# Patient Record
Sex: Female | Born: 1995 | Race: White | Hispanic: No | Marital: Single | Smoking: Current every day smoker
Health system: Southern US, Community
[De-identification: ages and names within clinical notes are randomized; demographics above are authoritative.]

## PROBLEM LIST (undated history)

## (undated) DIAGNOSIS — F32A Depression, unspecified: Secondary | ICD-10-CM

## (undated) DIAGNOSIS — F329 Major depressive disorder, single episode, unspecified: Secondary | ICD-10-CM

## (undated) HISTORY — PX: TONSILLECTOMY: SUR1361

---
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
## 2015-01-07 ENCOUNTER — Emergency Department
Admission: EM | Admit: 2015-01-07 | Discharge: 2015-01-07 | Disposition: A | Discharge status: Home / Self Care | Payer: Medicaid Other | Attending: Emergency Medicine | Admitting: Emergency Medicine

## 2015-01-07 ENCOUNTER — Encounter: Payer: Self-pay | Admitting: Emergency Medicine

## 2015-01-07 DIAGNOSIS — R519 Headache, unspecified: Secondary | ICD-10-CM

## 2015-01-07 DIAGNOSIS — F17200 Nicotine dependence, unspecified, uncomplicated: Secondary | ICD-10-CM | POA: Diagnosis not present

## 2015-01-07 DIAGNOSIS — R51 Headache: Secondary | ICD-10-CM | POA: Insufficient documentation

## 2015-01-07 HISTORY — DX: Depression, unspecified: F32.A

## 2015-01-07 HISTORY — DX: Major depressive disorder, single episode, unspecified: F32.9

## 2015-01-07 MED ORDER — KETOROLAC TROMETHAMINE 60 MG/2ML IM SOLN
60.0000 mg | Freq: Once | INTRAMUSCULAR | Status: AC
  Administered 2015-01-07: 60 mg via INTRAMUSCULAR
  Filled 2015-01-07: qty 2

## 2015-01-07 MED ORDER — DIAZEPAM 5 MG PO TABS
5.0000 mg | ORAL_TABLET | Freq: Once | ORAL | Status: AC
  Administered 2015-01-07: 5 mg via ORAL
  Filled 2015-01-07: qty 1

## 2015-01-07 MED ORDER — BUTALBITAL-APAP-CAFFEINE 50-325-40 MG PO TABS: 1.0000 | ORAL_TABLET | Freq: Four times a day (QID) | ORAL | Status: DC | PRN

## 2015-01-07 MED ORDER — IBUPROFEN 800 MG PO TABS: 800.0000 mg | ORAL_TABLET | Freq: Three times a day (TID) | ORAL | Status: DC | PRN

## 2015-01-07 NOTE — Discharge Instructions (Signed)

## 2015-01-07 NOTE — ED Notes (Signed)
Pt presents with headache for about three weeks, was started on new meds around that same time, side effect includes headaches, pcp aware and told pt that it would wear off. Pt take paroxetine for depression. Pt states she stopped taking the med and headaches continue.

## 2015-01-07 NOTE — ED Provider Notes (Signed)
[facility] Emergency Department Provider Note  ____________________________________________  Time seen: Approximately 6:40 PM  I have reviewed the triage vital signs and the nursing notes.   HISTORY  Chief Complaint Headache   HPI Christina Hall is a 19 y.o. female who presents for the headache for about 3 weeks. Patient states that she started a new medication for depression at the time however she has stopped the medications to complain of headaches. Denies any nausea vomiting photophobia phonophobia. Rates headache is a 5/10 at this point.Patient reports can't rule out stress currently going through a divorce, custody battle with her child.   Past Medical History  Diagnosis Date  . Depression     There are no active problems to display for this patient.   Past Surgical History  Procedure Laterality Date  . Tonsillectomy      Current Outpatient Rx  Name  Route  Sig  Dispense  Refill  . butalbital-acetaminophen-caffeine (FIORICET) 50-325-40 MG tablet   Oral   Take 1-2 tablets by mouth every 6 (six) hours as needed for headache.   20 tablet   0   . ibuprofen (ADVIL,MOTRIN) 800 MG tablet   Oral   Take 1 tablet (800 mg total) by mouth every 8 (eight) hours as needed.   30 tablet   0     Allergies Review of patient's allergies indicates no known allergies.  No family history on file.  Social History Social History  Substance Use Topics  . Smoking status: Current Some Day Smoker  . Smokeless tobacco: None  . Alcohol Use: No    Review of Systems Constitutional: No fever/chills Eyes: No visual changes. ENT: No sore throat. Cardiovascular: Denies chest pain. Respiratory: Denies shortness of breath. Gastrointestinal: No abdominal pain.  No nausea, no vomiting.  No diarrhea.  No constipation. Genitourinary: Negative for dysuria. Musculoskeletal: Negative for back pain. Skin: Negative for rash. Neurological: Positive for  headaches, negative for focal weakness or numbness.  10-point ROS otherwise negative.  ____________________________________________   PHYSICAL EXAM:  VITAL SIGNS: ED Triage Vitals  Enc Vitals Group     BP 01/07/15 1805 150/85 mmHg     Pulse Rate 01/07/15 1805 81     Resp 01/07/15 1805 20     Temp 01/07/15 1805 98.4 F (36.9 C)     Temp Source 01/07/15 1805 Oral     SpO2 01/07/15 1805 99 %     Weight 01/07/15 1805 139 lb (63.05 kg)     Height 01/07/15 1805 5\' 5"  (1.651 m)     Head Cir --      Peak Flow --      Pain Score 01/07/15 1806 7     Pain Loc --      Pain Edu? --      Excl. in GC? --     Constitutional: Alert and oriented. Well appearing and in no acute distress. Eyes: Conjunctivae are normal. PERRL. EOMI. Head: Atraumatic. Nose: No congestion/rhinnorhea. Mouth/Throat: Mucous membranes are moist.  Oropharynx non-erythematous. Neck: No stridor.  No cervical tenderness Cardiovascular: Normal rate, regular rhythm. Grossly normal heart sounds.  Good peripheral circulation. Respiratory: Normal respiratory effort.  No retractions. Lungs CTAB. Musculoskeletal: No lower extremity tenderness nor edema.  No joint effusions. Neurologic:  Normal speech and language. No gross focal neurologic deficits are appreciated. No gait instability. Skin:  Skin is warm, dry and intact. No rash noted. Psychiatric: Mood and affect are normal. Speech and behavior are normal.  ____________________________________________  LABS (all labs ordered are listed, but only abnormal results are displayed)  Labs Reviewed - No data to display ____________________________________________   PROCEDURES  Procedure(s) performed: None  Critical Care performed: No  ____________________________________________   INITIAL IMPRESSION / ASSESSMENT AND PLAN / ED COURSE  Pertinent labs & imaging results that were available during my care of the patient were reviewed by me and considered in my medical  decision making (see chart for details).  Rx given for Fioricet for headaches. Continue with Motrin 800 mg 3 times a day as needed. Follow up with PCP or return to the ER with any worsening symptomology. Patient states improvement prior to discharge ____________________________________________   FINAL CLINICAL IMPRESSION(S) / ED DIAGNOSES  Final diagnoses:  Nonintractable episodic headache, unspecified headache type     Evangeline Dakin, PA-C 01/07/15 1910  Myrna Blazer, MD 01/07/15 2209

## 2015-10-20 ENCOUNTER — Encounter: Payer: Self-pay | Admitting: Emergency Medicine

## 2015-10-20 ENCOUNTER — Emergency Department: Payer: Medicaid Other

## 2015-10-20 ENCOUNTER — Emergency Department
Admission: EM | Admit: 2015-10-20 | Discharge: 2015-10-20 | Disposition: A | Discharge status: Home / Self Care | Payer: Medicaid Other | Attending: Emergency Medicine | Admitting: Emergency Medicine

## 2015-10-20 DIAGNOSIS — O4692 Antepartum hemorrhage, unspecified, second trimester: Secondary | ICD-10-CM | POA: Insufficient documentation

## 2015-10-20 DIAGNOSIS — Z5321 Procedure and treatment not carried out due to patient leaving prior to being seen by health care provider: Secondary | ICD-10-CM | POA: Insufficient documentation

## 2015-10-20 DIAGNOSIS — F17200 Nicotine dependence, unspecified, uncomplicated: Secondary | ICD-10-CM | POA: Diagnosis not present

## 2015-10-20 DIAGNOSIS — O209 Hemorrhage in early pregnancy, unspecified: Secondary | ICD-10-CM

## 2015-10-20 DIAGNOSIS — Z3A22 22 weeks gestation of pregnancy: Secondary | ICD-10-CM | POA: Insufficient documentation

## 2015-10-20 LAB — URINALYSIS COMPLETE WITH MICROSCOPIC (ARMC ONLY)
BACTERIA UA: NONE SEEN
BILIRUBIN URINE: NEGATIVE
Glucose, UA: NEGATIVE mg/dL
Ketones, ur: NEGATIVE mg/dL
Nitrite: NEGATIVE
PROTEIN: NEGATIVE mg/dL
Specific Gravity, Urine: 1.017 (ref 1.005–1.030)
pH: 6 (ref 5.0–8.0)

## 2015-10-20 LAB — POCT PREGNANCY, URINE: PREG TEST UR: NEGATIVE

## 2015-10-20 LAB — ABO/RH: ABO/RH(D): O POS

## 2015-10-20 LAB — HCG, QUANTITATIVE, PREGNANCY: HCG, BETA CHAIN, QUANT, S: 12 m[IU]/mL — AB (ref <5)

## 2015-10-20 NOTE — ED Notes (Signed)
Pt standing at the doorway, this RN asked if there was anything she needed help with. Pt asked when she would be seen by the MD. This RN told her that I wasn't sure as the MD was in another room but that I would find out and let her know. A few minutes later pt seen leaving the room with her visitor and walking out. Gown left on stretcher.

## 2015-10-20 NOTE — ED Triage Notes (Addendum)
Positive pregnancy test by PCP last week.  Today patient noticed vaginal bleeding after work.  Noticed red on toilet paper after urinating.  Denies abdominal pain.    Approximately 1 hour PTA.

## 2015-10-20 NOTE — ED Notes (Signed)
Pt reports she had a positive pregnancy test at her PCP on 8?22/17. Pt reports today after urinating she had blood on the tissue when she wiped. Pt denies pain at this time but does report intermittent cramps over the last few days.

## 2015-10-23 NOTE — ED Provider Notes (Signed)
This patient presented for vaginal bleeding, and left prior to my evaluation. Her hCG beta was 12, and her ultrasound did not demonstrate an intrauterine pregnancy. She had reassuring vital signs and was ambulatory at the time that she eloped.   Rockne MenghiniAnne-Caroline Tensley Wery, MD 10/23/15 2257

## 2015-12-22 ENCOUNTER — Encounter: Payer: Self-pay | Admitting: Emergency Medicine

## 2015-12-22 ENCOUNTER — Emergency Department
Admission: EM | Admit: 2015-12-22 | Discharge: 2015-12-22 | Disposition: A | Discharge status: Home / Self Care | Payer: Medicaid Other | Attending: Emergency Medicine | Admitting: Emergency Medicine

## 2015-12-22 DIAGNOSIS — W57XXXA Bitten or stung by nonvenomous insect and other nonvenomous arthropods, initial encounter: Secondary | ICD-10-CM | POA: Insufficient documentation

## 2015-12-22 DIAGNOSIS — L03113 Cellulitis of right upper limb: Secondary | ICD-10-CM | POA: Diagnosis not present

## 2015-12-22 DIAGNOSIS — Y929 Unspecified place or not applicable: Secondary | ICD-10-CM | POA: Insufficient documentation

## 2015-12-22 DIAGNOSIS — Y999 Unspecified external cause status: Secondary | ICD-10-CM | POA: Insufficient documentation

## 2015-12-22 DIAGNOSIS — Y939 Activity, unspecified: Secondary | ICD-10-CM | POA: Diagnosis not present

## 2015-12-22 DIAGNOSIS — M7989 Other specified soft tissue disorders: Secondary | ICD-10-CM | POA: Diagnosis present

## 2015-12-22 DIAGNOSIS — F17210 Nicotine dependence, cigarettes, uncomplicated: Secondary | ICD-10-CM | POA: Diagnosis not present

## 2015-12-22 MED ORDER — SULFAMETHOXAZOLE-TRIMETHOPRIM 800-160 MG PO TABS
1.0000 | ORAL_TABLET | Freq: Once | ORAL | Status: AC
  Administered 2015-12-22: 1 via ORAL
  Filled 2015-12-22: qty 1

## 2015-12-22 MED ORDER — SULFAMETHOXAZOLE-TRIMETHOPRIM 800-160 MG PO TABS: 1.0000 | ORAL_TABLET | Freq: Two times a day (BID) | ORAL | 0 refills | Status: DC

## 2015-12-22 NOTE — ED Provider Notes (Signed)
[facility] Emergency Department Provider Note  ____________________________________________  Time seen: Approximately 10:04 PM  I have reviewed the triage vital signs and the nursing notes.   HISTORY  Chief Complaint Insect Bite    HPI Christina Hall is a 20 y.o. female who presents emergency department for complaint of left forearm swelling and redness. Patient states that she woke up this morning with what appeared to be a "bug bite" patient did not think anything of it and went to work. Throughout the day the pain began to appear and the area became red and swollen. She denies any drainage from the site. She denies any fevers or chills. She denies any systemic complaints.   Past Medical History:  Diagnosis Date  . Depression     There are no active problems to display for this patient.   Past Surgical History:  Procedure Laterality Date  . TONSILLECTOMY      Prior to Admission medications   Medication Sig Start Date End Date Taking? Authorizing Provider  sulfamethoxazole-trimethoprim (BACTRIM DS,SEPTRA DS) 800-160 MG tablet Take 1 tablet by mouth 2 (two) times daily. 12/22/15   Delorise RoyalsJonathan D Kenon Delashmit, PA-C    Allergies Penicillins  History reviewed. No pertinent family history.  Social History Social History  Substance Use Topics  . Smoking status: Current Some Day Smoker    Types: Cigarettes  . Smokeless tobacco: Never Used  . Alcohol use No     Review of Systems  Constitutional: No fever/chills Cardiovascular: no chest pain. Respiratory: no cough. No SOB. Musculoskeletal: Negative for musculoskeletal pain. Skin: Positive for "infected bug bite." To right forearm Neurological: Negative for headaches, focal weakness or numbness. 10-point ROS otherwise negative.  ____________________________________________   PHYSICAL EXAM:  VITAL SIGNS: ED Triage Vitals [12/22/15 2157]  Enc Vitals Group     BP 136/76     Pulse Rate 82      Resp 18     Temp 98.2 F (36.8 C)     Temp Source Oral     SpO2 98 %     Weight 132 lb (59.9 kg)     Height 5\' 5"  (1.651 m)     Head Circumference      Peak Flow      Pain Score 7     Pain Loc      Pain Edu?      Excl. in GC?      Constitutional: Alert and oriented. Well appearing and in no acute distress. Eyes: Conjunctivae are normal. PERRL. EOMI. Head: Atraumatic.  Cardiovascular: Normal rate, regular rhythm. Normal S1 and S2.  Good peripheral circulation. Respiratory: Normal respiratory effort without tachypnea or retractions. Lungs CTAB. Good air entry to the bases with no decreased or absent breath sounds. Musculoskeletal: Full range of motion to all extremities. No gross deformities appreciated. Neurologic:  Normal speech and language. No gross focal neurologic deficits are appreciated.  Skin:  Skin is warm, dry and intact. No rash noted. Erythematous and edematous skin lesion noted to the right forearm. Area measures approximately 4 cm in diameter. Area is erythematous, warm to touch. No fluctuance or induration. No drainage. Psychiatric: Mood and affect are normal. Speech and behavior are normal. Patient exhibits appropriate insight and judgement.   ____________________________________________   LABS (all labs ordered are listed, but only abnormal results are displayed)  Labs Reviewed - No data to display ____________________________________________  EKG   ____________________________________________  RADIOLOGY   No results found.  ____________________________________________    PROCEDURES  Procedure(s) performed:    Procedures    Medications  sulfamethoxazole-trimethoprim (BACTRIM DS,SEPTRA DS) 800-160 MG per tablet 1 tablet (1 tablet Oral Given 12/22/15 2211)     ____________________________________________   INITIAL IMPRESSION / ASSESSMENT AND PLAN / ED COURSE  Pertinent labs & imaging results that were available during my care of  the patient were reviewed by me and considered in my medical decision making (see chart for details).  Review of the [place] CSRS was performed in accordance of the [facility] prior to dispensing any controlled drugs.  Clinical Course    Patient's diagnosis is consistent with Cellulitis to the right forearm. Area is mild with no indication of abscess requiring incision and drainage at this time. Patient does not have a history of recurrent skin lesions.. Patient will be discharged home with prescriptions for oral antibiotics. Patient is to follow up with primary care as needed or otherwise directed. Patient is given ED precautions to return to the ED for any worsening or new symptoms.     ____________________________________________  FINAL CLINICAL IMPRESSION(S) / ED DIAGNOSES  Final diagnoses:  Cellulitis of right upper extremity      NEW MEDICATIONS STARTED DURING THIS VISIT:  New Prescriptions   SULFAMETHOXAZOLE-TRIMETHOPRIM (BACTRIM DS,SEPTRA DS) 800-160 MG TABLET    Take 1 tablet by mouth 2 (two) times daily.        This chart was dictated using voice recognition software/Dragon. Despite best efforts to proofread, errors can occur which can change the meaning. Any change was purely unintentional.    Racheal PatchesJonathan D Summar Mcglothlin, PA-C 12/22/15 2211    Minna AntisKevin Paduchowski, MD 12/22/15 2243

## 2015-12-22 NOTE — ED Triage Notes (Signed)
Pt says she woke this am with redness and swelling to right forearm; tender to touch; denies itching; wonders if she was bitten by something in the night; no fever;

## 2016-04-06 ENCOUNTER — Emergency Department
Admission: EM | Admit: 2016-04-06 | Discharge: 2016-04-06 | Disposition: A | Discharge status: Home / Self Care | Payer: Medicaid Other | Attending: Emergency Medicine | Admitting: Emergency Medicine

## 2016-04-06 ENCOUNTER — Encounter: Payer: Self-pay | Admitting: Emergency Medicine

## 2016-04-06 DIAGNOSIS — B373 Candidiasis of vulva and vagina: Secondary | ICD-10-CM | POA: Insufficient documentation

## 2016-04-06 DIAGNOSIS — F17210 Nicotine dependence, cigarettes, uncomplicated: Secondary | ICD-10-CM | POA: Diagnosis not present

## 2016-04-06 DIAGNOSIS — B3731 Acute candidiasis of vulva and vagina: Secondary | ICD-10-CM

## 2016-04-06 DIAGNOSIS — N3000 Acute cystitis without hematuria: Secondary | ICD-10-CM | POA: Diagnosis not present

## 2016-04-06 DIAGNOSIS — L02416 Cutaneous abscess of left lower limb: Secondary | ICD-10-CM | POA: Insufficient documentation

## 2016-04-06 DIAGNOSIS — R300 Dysuria: Secondary | ICD-10-CM | POA: Diagnosis present

## 2016-04-06 DIAGNOSIS — L0291 Cutaneous abscess, unspecified: Secondary | ICD-10-CM

## 2016-04-06 LAB — URINALYSIS, COMPLETE (UACMP) WITH MICROSCOPIC
BILIRUBIN URINE: NEGATIVE
Glucose, UA: NEGATIVE mg/dL
KETONES UR: NEGATIVE mg/dL
Nitrite: POSITIVE — AB
PH: 6 (ref 5.0–8.0)
PROTEIN: NEGATIVE mg/dL
RBC / HPF: NONE SEEN RBC/hpf (ref 0–5)
Specific Gravity, Urine: 1.017 (ref 1.005–1.030)
WBC UA: NONE SEEN WBC/hpf (ref 0–5)

## 2016-04-06 MED ORDER — SULFAMETHOXAZOLE-TRIMETHOPRIM 800-160 MG PO TABS: 1.0000 | ORAL_TABLET | Freq: Two times a day (BID) | ORAL | 0 refills | Status: DC

## 2016-04-06 MED ORDER — FLUCONAZOLE 150 MG PO TABS
150.0000 mg | ORAL_TABLET | Freq: Every day | ORAL | 0 refills | Status: DC
Start: 2016-04-06 — End: 2016-05-25

## 2016-04-06 NOTE — ED Triage Notes (Signed)
Pt reports abscess to top of left leg, also reports white vaginal discharge after being on antibiotics recently.

## 2016-04-06 NOTE — ED Provider Notes (Signed)
[facility] Emergency Department Provider Note  ___________________________________________   I have reviewed the triage vital signs and the nursing notes.   HISTORY  Chief Complaint Abscess and Abdominal Pain   HPI Christina Hall is a 21 y.o. female who presents to the emergency department for evaluation of a painful lesion on her left leg and vaginal discharge with dysuria. She was recently treated for UTI, but continues to have urinary frequency and dysuria. She also states that she has a thick, white vaginal discharge thought to be a yeast infection. She denies fevers or recent known STI exposure.    Past Medical History:  Diagnosis Date  . Depression     There are no active problems to display for this patient.   Past Surgical History:  Procedure Laterality Date  . TONSILLECTOMY      Prior to Admission medications   Medication Sig Start Date End Date Taking? Authorizing Provider  fluconazole (DIFLUCAN) 150 MG tablet Take 1 tablet (150 mg total) by mouth daily. 04/06/16   Chinita Pester, FNP  sulfamethoxazole-trimethoprim (BACTRIM DS,SEPTRA DS) 800-160 MG tablet Take 1 tablet by mouth 2 (two) times daily. 04/06/16   Chinita Pester, FNP    Allergies Penicillins  History reviewed. No pertinent family history.  Social History Social History  Substance Use Topics  . Smoking status: Current Some Day Smoker    Types: Cigarettes  . Smokeless tobacco: Never Used  . Alcohol use No    Review of Systems Constitutional: No fever/chills Gastrointestinal: Positive for abdominal pain.  No nausea, no vomiting.  No diarrhea.  No constipation. Genitourinary: Positive for dysuria. Musculoskeletal: Negative for pain. Skin: Positive for painful lesion on left leg. Neurological: Negative for headaches, focal weakness or numbness. ____________________________________________   PHYSICAL EXAM:  VITAL SIGNS: ED Triage Vitals  Enc Vitals Group       BP 04/06/16 1226 118/67     Pulse Rate 04/06/16 1226 (!) 53     Resp --      Temp 04/06/16 1226 98.8 F (37.1 C)     Temp Source 04/06/16 1226 Oral     SpO2 04/06/16 1226 100 %     Weight 04/06/16 1227 132 lb (59.9 kg)     Height 04/06/16 1227 5\' 6"  (1.676 m)     Head Circumference --      Peak Flow --      Pain Score 04/06/16 1228 4     Pain Loc --      Pain Edu? --      Excl. in GC? --     Constitutional: Alert and oriented. Well appearing and in no acute distress. Cardiovascular: Normal rate, regular rhythm. Grossly normal heart sounds.  Good peripheral circulation. Respiratory: Normal respiratory effort.  No retractions. Lungs CTAB. Gastrointestinal: Suprapubic tenderness on palpation. No rebound or guarding. No distention. No abdominal bruits. No CVA tenderness. Genitourinary: Thick, white vaginal discharge. (No speculum exam) Musculoskeletal: No lower extremity tenderness nor edema.  No joint effusions. Neurologic:  Normal speech and language. No gross focal neurologic deficits are appreciated. No gait instability. Skin: 2 cm raised, erythematous, indurated area on anterior thigh without area of fluctuance. No lymphangitis. Psychiatric: Mood and affect are normal. Speech and behavior are normal.  ____________________________________________   LABS (all labs ordered are listed, but only abnormal results are displayed)  Labs Reviewed  URINALYSIS, COMPLETE (UACMP) WITH MICROSCOPIC - Abnormal; Notable for the following:       Result Value  Color, Urine YELLOW (*)    APPearance TURBID (*)    Hgb urine dipstick SMALL (*)    Nitrite POSITIVE (*)    Leukocytes, UA TRACE (*)    Bacteria, UA FEW (*)    Squamous Epithelial / LPF 0-5 (*)    All other components within normal limits    ____________________________________________  EKG   ____________________________________________  RADIOLOGY   ____________________________________________   PROCEDURES  Procedure(s) performed: None  Procedures  Critical Care performed: No  ____________________________________________   INITIAL IMPRESSION / ASSESSMENT AND PLAN / ED COURSE  Pertinent labs & imaging results that were available during my care of the patient were reviewed by me and considered in my medical decision making (see chart for details).  -year-old female presenting to the emergency department for dysuria, urinary frequency and white vaginal discharge in addition to an abscess on her left anterior thigh. Urinalysis showing significant acute cystitis which will be treated with Bactrim. Abscess does not require I&D today and Bactrim should cover that infection as well. She was given a prescription for Diflucan for suspected vaginal yeast infection. She was advised to follow-up with the health department for any persistent vaginal discharge or dysuria. She was encouraged to return to the emergency department for symptoms that change or worsen if she is unable schedule an appointment.     ____________________________________________   FINAL CLINICAL IMPRESSION(S) / ED DIAGNOSES  Final diagnoses:  Abscess  Acute cystitis without hematuria  Vaginal candidiasis      NEW MEDICATIONS STARTED DURING THIS VISIT:  Discharge Medication List as of 04/06/2016  1:39 PM    START taking these medications   Details  fluconazole (DIFLUCAN) 150 MG tablet Take 1 tablet (150 mg total) by mouth daily., Starting Mon 04/06/2016, Print         Note:  This document was prepared using Dragon voice recognition software and may include unintentional dictation errors.    Chinita PesterCari B Landry Lookingbill, FNP 04/06/16 1357    Rockne MenghiniAnne-Caroline Norman, MD 04/06/16 1524

## 2016-05-25 ENCOUNTER — Encounter: Payer: Self-pay | Admitting: Emergency Medicine

## 2016-05-25 ENCOUNTER — Emergency Department
Admission: EM | Admit: 2016-05-25 | Discharge: 2016-05-25 | Disposition: A | Discharge status: Home / Self Care | Payer: Medicaid Other | Attending: Student in an Organized Health Care Education/Training Program | Admitting: Student in an Organized Health Care Education/Training Program

## 2016-05-25 DIAGNOSIS — L03011 Cellulitis of right finger: Secondary | ICD-10-CM | POA: Insufficient documentation

## 2016-05-25 DIAGNOSIS — B078 Other viral warts: Secondary | ICD-10-CM | POA: Insufficient documentation

## 2016-05-25 DIAGNOSIS — F17210 Nicotine dependence, cigarettes, uncomplicated: Secondary | ICD-10-CM | POA: Insufficient documentation

## 2016-05-25 DIAGNOSIS — L0390 Cellulitis, unspecified: Secondary | ICD-10-CM

## 2016-05-25 DIAGNOSIS — M79644 Pain in right finger(s): Secondary | ICD-10-CM | POA: Diagnosis present

## 2016-05-25 MED ORDER — CLINDAMYCIN HCL 300 MG PO CAPS: 300.0000 mg | ORAL_CAPSULE | Freq: Three times a day (TID) | ORAL | 0 refills | Status: AC

## 2016-05-25 NOTE — ED Triage Notes (Signed)
States she has had warts to right thumb for several months   Now right thumb is swollen with increased pain over the past 2 days   Unsure of injury

## 2016-05-25 NOTE — ED Provider Notes (Signed)
[facility] Emergency Department Provider Note  ____________________________________________  Time seen: Approximately 12:40 PM  I have reviewed the triage vital signs and the nursing notes.   HISTORY  Chief Complaint Hand Pain    HPI Christina Hall is a 21 y.o. female that presents to the emergency department with right thumb pain for one day and work for one year. Patient states that she has 3 warts on her right hand and has had them for about a year. They have never caused her problems before. This morning she started feeling pain in the tip of her right thumb. She states there is redness and swelling around wart. 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 She denies fever, chills, shortness of breath, chest pain, nausea, vomiting, abdominal pain.   Past Medical History:  Diagnosis Date  . Depression     There are no active problems to display for this patient.   Past Surgical History:  Procedure Laterality Date  . TONSILLECTOMY      Prior to Admission medications   Medication Sig Start Date End Date Taking? Authorizing Provider  clindamycin (CLEOCIN) 300 MG capsule Take 1 capsule (300 mg total) by mouth 3 (three) times daily. 05/25/16 06/04/16  Enid Derry, PA-C    Allergies Penicillins  No family history on file.  Social History Social History  Substance Use Topics  . Smoking status: Current Some Day Smoker    Types: Cigarettes  . Smokeless tobacco: Never Used  . Alcohol use No     Review of Systems  Constitutional: No fever/chills ENT: No upper respiratory complaints. Cardiovascular: No chest pain. Respiratory: No SOB. Gastrointestinal: No abdominal pain.  No nausea, no vomiting.  Musculoskeletal: Positive for thumb pain. Skin: Negative for abrasions, lacerations, ecchymosis. Neurological: Negative for headaches, numbness or tingling   ____________________________________________   PHYSICAL EXAM:  VITAL SIGNS: ED Triage  Vitals [05/25/16 1230]  Enc Vitals Group     BP 131/90     Pulse Rate 94     Resp 16     Temp 98 F (36.7 C)     Temp Source Oral     SpO2 100 %     Weight      Height      Head Circumference      Peak Flow      Pain Score      Pain Loc      Pain Edu?      Excl. in GC?      Constitutional: Alert and oriented. Well appearing and in no acute distress. Eyes: Conjunctivae are normal. PERRL. EOMI. Head: Atraumatic. ENT:      Ears:      Nose: No congestion/rhinnorhea.      Mouth/Throat: Mucous membranes are moist.  Neck: No stridor.   Cardiovascular: Normal rate, regular rhythm.  Good peripheral circulation. 2+ radial pulses. Respiratory: Normal respiratory effort without tachypnea or retractions. Lungs CTAB. Good air entry to the bases with no decreased or absent breath sounds. Musculoskeletal: Full range of motion to all extremities. No gross deformities appreciated. Range of motion of right thumb. Neurologic:  Normal speech and language. No gross focal neurologic deficits are appreciated.  Skin:  Skin is warm, dry and intact. Fleshy, irregular hard mass on right thumb. Pinpoint black spots throughout lesion. Redness and swelling around lesion.    ____________________________________________   LABS (all labs ordered are listed, but only abnormal results are displayed)  Labs Reviewed - No data to display ____________________________________________  EKG   ____________________________________________  RADIOLOGY  No results found.  ____________________________________________    PROCEDURES  Procedure(s) performed:    Procedures    Medications - No data to display   ____________________________________________   INITIAL IMPRESSION / ASSESSMENT AND PLAN / ED COURSE  Pertinent labs & imaging results that were available during my care of the patient were reviewed by me and considered in my medical decision making (see chart for details).  Review of the  [place] CSRS was performed in accordance of the [facility] prior to dispensing any controlled drugs.     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 Vital signs and exam are reassuring. Patient is allergic to penicillins. She will be discharged with a prescription for clindamycin. She is going to make appointment with her PCP next week to discuss removing her warts. Patient is given ED precautions to return to the ED for any worsening or new symptoms.     ____________________________________________  FINAL CLINICAL IMPRESSION(S) / ED DIAGNOSES  Final diagnoses:  Other viral warts  Wound cellulitis      NEW MEDICATIONS STARTED DURING THIS VISIT:  New Prescriptions   CLINDAMYCIN (CLEOCIN) 300 MG CAPSULE    Take 1 capsule (300 mg total) by mouth 3 (three) times daily.        This chart was dictated using voice recognition software/Dragon. Despite best efforts to proofread, errors can occur which can change the meaning. Any change was purely unintentional.    Enid Derry, PA-C 05/25/16 1355    Willy Eddy, MD 05/25/16 [id number]

## 2016-05-28 ENCOUNTER — Emergency Department
Admission: EM | Admit: 2016-05-28 | Discharge: 2016-05-28 | Disposition: A | Discharge status: Home / Self Care | Payer: Medicaid Other | Attending: Emergency Medicine | Admitting: Emergency Medicine

## 2016-05-28 ENCOUNTER — Encounter: Payer: Self-pay | Admitting: Emergency Medicine

## 2016-05-28 DIAGNOSIS — L03011 Cellulitis of right finger: Secondary | ICD-10-CM | POA: Diagnosis not present

## 2016-05-28 DIAGNOSIS — IMO0002 Reserved for concepts with insufficient information to code with codable children: Secondary | ICD-10-CM

## 2016-05-28 DIAGNOSIS — F17210 Nicotine dependence, cigarettes, uncomplicated: Secondary | ICD-10-CM | POA: Insufficient documentation

## 2016-05-28 MED ORDER — LIDOCAINE HCL (PF) 1 % IJ SOLN
10.0000 mL | Freq: Once | INTRAMUSCULAR | Status: AC
  Administered 2016-05-28: 10 mL
  Filled 2016-05-28: qty 10

## 2016-05-28 MED ORDER — CLINDAMYCIN HCL 300 MG PO CAPS: 300.0000 mg | ORAL_CAPSULE | Freq: Three times a day (TID) | ORAL | 0 refills | Status: DC

## 2016-05-28 MED ORDER — HYDROCODONE-ACETAMINOPHEN 5-325 MG PO TABS
1.0000 | ORAL_TABLET | Freq: Once | ORAL | Status: AC
  Administered 2016-05-28: 1 via ORAL
  Filled 2016-05-28: qty 1

## 2016-05-28 MED ORDER — HYDROCODONE-ACETAMINOPHEN 5-325 MG PO TABS: 1.0000 | ORAL_TABLET | ORAL | 0 refills | Status: DC | PRN
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
## 2016-06-16 ENCOUNTER — Emergency Department
Admission: EM | Admit: 2016-06-16 | Discharge: 2016-06-16 | Disposition: A | Discharge status: Home / Self Care | Payer: Medicaid Other | Attending: Emergency Medicine | Admitting: Emergency Medicine

## 2016-06-16 ENCOUNTER — Encounter: Payer: Self-pay | Admitting: Emergency Medicine

## 2016-06-16 DIAGNOSIS — Z202 Contact with and (suspected) exposure to infections with a predominantly sexual mode of transmission: Secondary | ICD-10-CM | POA: Insufficient documentation

## 2016-06-16 DIAGNOSIS — F17210 Nicotine dependence, cigarettes, uncomplicated: Secondary | ICD-10-CM | POA: Diagnosis not present

## 2016-06-16 LAB — URINALYSIS, COMPLETE (UACMP) WITH MICROSCOPIC
BACTERIA UA: NONE SEEN
Bilirubin Urine: NEGATIVE
Glucose, UA: NEGATIVE mg/dL
Hgb urine dipstick: NEGATIVE
Ketones, ur: NEGATIVE mg/dL
NITRITE: NEGATIVE
Protein, ur: NEGATIVE mg/dL
SPECIFIC GRAVITY, URINE: 1.016 (ref 1.005–1.030)
pH: 7 (ref 5.0–8.0)

## 2016-06-16 LAB — WET PREP, GENITAL
CLUE CELLS WET PREP: NONE SEEN
Sperm: NONE SEEN
Trich, Wet Prep: NONE SEEN
WBC WET PREP: NONE SEEN
YEAST WET PREP: NONE SEEN

## 2016-06-16 LAB — CHLAMYDIA/NGC RT PCR (ARMC ONLY)
Chlamydia Tr: NOT DETECTED
N GONORRHOEAE: NOT DETECTED

## 2016-06-16 LAB — POCT PREGNANCY, URINE: PREG TEST UR: NEGATIVE

## 2016-06-16 MED ORDER — CEFTRIAXONE SODIUM 250 MG IJ SOLR
250.0000 mg | Freq: Once | INTRAMUSCULAR | Status: AC
  Administered 2016-06-16: 250 mg via INTRAMUSCULAR
  Filled 2016-06-16: qty 250

## 2016-06-16 MED ORDER — AZITHROMYCIN 500 MG PO TABS
1000.0000 mg | ORAL_TABLET | Freq: Once | ORAL | Status: AC
  Administered 2016-06-16: 1000 mg via ORAL
  Filled 2016-06-16: qty 2

## 2016-06-16 NOTE — Discharge Instructions (Signed)
You have been treated for gonorrhea and chlamydia. Follow-up with the health department for continued symptoms.
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
## 2016-08-04 ENCOUNTER — Emergency Department
Admission: EM | Admit: 2016-08-04 | Discharge: 2016-08-04 | Disposition: A | Discharge status: Home / Self Care | Payer: Medicaid Other | Attending: Student in an Organized Health Care Education/Training Program | Admitting: Student in an Organized Health Care Education/Training Program

## 2016-08-04 ENCOUNTER — Encounter: Payer: Self-pay | Admitting: Emergency Medicine

## 2016-08-04 ENCOUNTER — Emergency Department: Payer: Medicaid Other

## 2016-08-04 DIAGNOSIS — Y92019 Unspecified place in single-family (private) house as the place of occurrence of the external cause: Secondary | ICD-10-CM | POA: Diagnosis not present

## 2016-08-04 DIAGNOSIS — F17210 Nicotine dependence, cigarettes, uncomplicated: Secondary | ICD-10-CM | POA: Insufficient documentation

## 2016-08-04 DIAGNOSIS — Y9389 Activity, other specified: Secondary | ICD-10-CM | POA: Diagnosis not present

## 2016-08-04 DIAGNOSIS — T7411XA Adult physical abuse, confirmed, initial encounter: Secondary | ICD-10-CM | POA: Diagnosis not present

## 2016-08-04 DIAGNOSIS — M542 Cervicalgia: Secondary | ICD-10-CM

## 2016-08-04 DIAGNOSIS — Y998 Other external cause status: Secondary | ICD-10-CM | POA: Insufficient documentation

## 2016-08-04 DIAGNOSIS — Z880 Allergy status to penicillin: Secondary | ICD-10-CM | POA: Insufficient documentation

## 2016-08-04 DIAGNOSIS — R300 Dysuria: Secondary | ICD-10-CM | POA: Diagnosis not present

## 2016-08-04 LAB — CHLAMYDIA/NGC RT PCR (ARMC ONLY)
CHLAMYDIA TR: NOT DETECTED
N gonorrhoeae: NOT DETECTED

## 2016-08-04 LAB — URINALYSIS, COMPLETE (UACMP) WITH MICROSCOPIC
BILIRUBIN URINE: NEGATIVE
GLUCOSE, UA: NEGATIVE mg/dL
HGB URINE DIPSTICK: NEGATIVE
Ketones, ur: 5 mg/dL — AB
LEUKOCYTES UA: NEGATIVE
NITRITE: NEGATIVE
Protein, ur: NEGATIVE mg/dL
SPECIFIC GRAVITY, URINE: 1.018 (ref 1.005–1.030)
pH: 5 (ref 5.0–8.0)

## 2016-08-04 LAB — POCT PREGNANCY, URINE: Preg Test, Ur: NEGATIVE

## 2016-08-04 MED ORDER — IOPAMIDOL (ISOVUE-370) INJECTION 76%
75.0000 mL | Freq: Once | INTRAVENOUS | Status: AC | PRN
  Administered 2016-08-04: 75 mL via INTRAVENOUS

## 2016-08-04 MED ORDER — DIAZEPAM 5 MG PO TABS: 5.0000 mg | ORAL_TABLET | Freq: Every day | ORAL | 0 refills | Status: AC
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
## 2016-08-04 NOTE — ED Notes (Signed)
Pt in ct - sane nurse in her office awaiting pt's return

## 2016-08-04 NOTE — ED Notes (Signed)
Introduced myself to patient after dr. Roxan Hockeyobinson saw pt. Pt  States she is having vaginal discharge. When asked when it started she started yelling that she had already told "8or 9 doctors and nurses". Advised I only needed to know if it started before or after thurs. Pt states after thurs. Dr. Bevely PalmerMade aware and will go gc, chlamydia via the urine. Pt had previously refused sane nurse. After speaking with pt regarding what forensic nurses do ie: exam and document wounds and if possible gather evidence,she agreed to speak with sane nurse.
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
## 2016-08-04 NOTE — ED Notes (Signed)
Pt angry stating "im never coming back to " - pt states was never told information. Reminded pt that I had talked to her about the sane nurse. Pt had refused the sane nurse exam, and refused to allow dr Roxan Hockeyrobinson to do a pelvic exam. Still refuses pelvic exam to nurse and states will follow up with the health department. Pt also advised she needs to follow up with pcp if she needs renewal of valium rx given to help her sleep.

## 2016-08-04 NOTE — ED Notes (Signed)
Pt refused vs, wakling out during discharge.

## 2016-09-18 DIAGNOSIS — T7491XA Unspecified adult maltreatment, confirmed, initial encounter: Secondary | ICD-10-CM | POA: Insufficient documentation

## 2016-09-18 DIAGNOSIS — F329 Major depressive disorder, single episode, unspecified: Secondary | ICD-10-CM | POA: Insufficient documentation

## 2016-09-18 DIAGNOSIS — F32A Depression, unspecified: Secondary | ICD-10-CM | POA: Insufficient documentation

## 2016-09-18 DIAGNOSIS — F1120 Opioid dependence, uncomplicated: Secondary | ICD-10-CM | POA: Insufficient documentation

## 2016-09-18 DIAGNOSIS — Z720 Tobacco use: Secondary | ICD-10-CM | POA: Insufficient documentation

## 2017-09-03 ENCOUNTER — Ambulatory Visit
Admission: EM | Admit: 2017-09-03 | Discharge: 2017-09-03 | Disposition: A | Discharge status: Home / Self Care | Payer: Medicaid Other | Attending: Emergency Medicine | Admitting: Emergency Medicine

## 2017-09-03 DIAGNOSIS — F17210 Nicotine dependence, cigarettes, uncomplicated: Secondary | ICD-10-CM | POA: Insufficient documentation

## 2017-09-03 DIAGNOSIS — B9689 Other specified bacterial agents as the cause of diseases classified elsewhere: Secondary | ICD-10-CM | POA: Insufficient documentation

## 2017-09-03 DIAGNOSIS — Z880 Allergy status to penicillin: Secondary | ICD-10-CM | POA: Insufficient documentation

## 2017-09-03 DIAGNOSIS — N760 Acute vaginitis: Secondary | ICD-10-CM | POA: Insufficient documentation

## 2017-09-03 DIAGNOSIS — N898 Other specified noninflammatory disorders of vagina: Secondary | ICD-10-CM

## 2017-09-03 DIAGNOSIS — Z79899 Other long term (current) drug therapy: Secondary | ICD-10-CM | POA: Insufficient documentation

## 2017-09-03 DIAGNOSIS — F329 Major depressive disorder, single episode, unspecified: Secondary | ICD-10-CM | POA: Insufficient documentation

## 2017-09-03 DIAGNOSIS — R300 Dysuria: Secondary | ICD-10-CM | POA: Diagnosis not present

## 2017-09-03 LAB — URINALYSIS, COMPLETE (UACMP) WITH MICROSCOPIC
BILIRUBIN URINE: NEGATIVE
GLUCOSE, UA: NEGATIVE mg/dL
HGB URINE DIPSTICK: NEGATIVE
Ketones, ur: NEGATIVE mg/dL
LEUKOCYTES UA: NEGATIVE
Nitrite: NEGATIVE
PROTEIN: NEGATIVE mg/dL
RBC / HPF: NONE SEEN RBC/hpf (ref 0–5)
SPECIFIC GRAVITY, URINE: 1.02 (ref 1.005–1.030)
pH: 6 (ref 5.0–8.0)

## 2017-09-03 LAB — CHLAMYDIA/NGC RT PCR (ARMC ONLY)
Chlamydia Tr: NOT DETECTED
N gonorrhoeae: NOT DETECTED

## 2017-09-03 LAB — WET PREP, GENITAL
Sperm: NONE SEEN
Trich, Wet Prep: NONE SEEN
Yeast Wet Prep HPF POC: NONE SEEN

## 2017-09-03 LAB — PREGNANCY, URINE: PREG TEST UR: NEGATIVE

## 2017-09-03 MED ORDER — METRONIDAZOLE 500 MG PO TABS: 500.0000 mg | ORAL_TABLET | Freq: Two times a day (BID) | ORAL | 0 refills | Status: AC

## 2017-09-03 NOTE — ED Provider Notes (Signed)
HPI  SUBJECTIVE:  Christina Hall is a 22 y.o. female who presents with 1 month of dysuria, cloudy odorous vaginal discharge.  She denies vomiting, fevers, abdominal, back, pelvic pain.  No urinary urgency, frequency, cloudy or odorous urine, hematuria.  No genital rash, itching.  No dyspareunia.  Last intercourse was 2 weeks ago.  She is in a relatively new monogamous relationship with a female partner of 2 months.  He is asymptomatic to her knowledge, however STDs are a concern today.  She was on Macrobid recently.  No antipyretic in the past 6 to 8 hours.  She tried Macrobid, Azo for her symptoms without improvement in her symptoms.  Symptoms are worse with urinating.  She has a past medical history of chlamydia, frequent BV.  She states this feels similar to previous BV infections.  No history of gonorrhea, HIV, HSV, syphilis, Trichomonas, yeast.  Also past medical history of depression, opiate abuse on Suboxone.  No history of diabetes.  LMP: 3 weeks ago.  Not using any birth control.  PMD: [facility].  Patient established care with her primary care physician on 7/2, was prescribed Macrobid for presumed UTI.  She was reporting dysuria at that time.  Past Medical History:  Diagnosis Date  . Depression     Past Surgical History:  Procedure Laterality Date  . TONSILLECTOMY      No family history on file.  Social History   Tobacco Use  . Smoking status: Current Some Day Smoker    Packs/day: 0.50    Types: Cigarettes  . Smokeless tobacco: Never Used  Substance Use Topics  . Alcohol use: No  . Drug use: No    No current facility-administered medications for this encounter.   Current Outpatient Medications:  .  sertraline (ZOLOFT) 25 MG tablet, Take by mouth., Disp: , Rfl:  .  SUBOXONE 8-2 MG FILM, PLACE 1 FILM (8 MG TOTAL) UNDER THE TONGUE DAILY. FOR 21 DAYS, Disp: , Rfl: 0 .  metroNIDAZOLE (FLAGYL) 500 MG tablet, Take 1 tablet (500 mg total) by mouth 2 (two) times daily  for 7 days., Disp: 14 tablet, Rfl: 0  Allergies  Allergen Reactions  . Penicillins Anaphylaxis     ROS  As noted in HPI.   Physical Exam  BP (!) 141/73 (BP Location: Right Arm)   Pulse 66   Temp 98.5 F (36.9 C) (Oral)   Resp 18   LMP 08/20/2017 (Exact Date)   SpO2 100%   Breastfeeding? No   Constitutional: Well developed, well nourished, no acute distress Eyes:  EOMI, conjunctiva normal bilaterally HENT: Normocephalic, atraumatic,mucus membranes moist Respiratory: Normal inspiratory effort Cardiovascular: Normal rate GI: nondistended soft, nontender. No suprapubic tenderness  back: No CVA tenderness GU: Deferred skin: No rash, skin intact Musculoskeletal: no deformities Neurologic: Alert & oriented x 3, no focal neuro deficits Psychiatric: Speech and behavior appropriate   ED Course   Medications - No data to display  Orders Placed This Encounter  Procedures  . Chlamydia/NGC rt PCR    Standing Status:   Standing    Number of Occurrences:   1    Order Specific Question:   Patient immune status    Answer:   Normal  . Wet prep, genital    Standing Status:   Standing    Number of Occurrences:   1    Order Specific Question:   Patient immune status    Answer:   Normal  . Urinalysis, Complete w Microscopic  Standing Status:   Standing    Number of Occurrences:   1  . Pregnancy, urine    Standing Status:   Standing    Number of Occurrences:   1  . RPR    Standing Status:   Standing    Number of Occurrences:   1  . HIV antibody    Standing Status:   Standing    Number of Occurrences:   1    Results for orders placed or performed during the hospital encounter of 09/03/17 (from the past 24 hour(s))  Urinalysis, Complete w Microscopic     Status: Abnormal   Collection Time: 09/03/17  3:25 PM  Result Value Ref Range   Color, Urine YELLOW YELLOW   APPearance CLOUDY (A) CLEAR   Specific Gravity, Urine 1.020 1.005 - 1.030   pH 6.0 5.0 - 8.0   Glucose,  UA NEGATIVE NEGATIVE mg/dL   Hgb urine dipstick NEGATIVE NEGATIVE   Bilirubin Urine NEGATIVE NEGATIVE   Ketones, ur NEGATIVE NEGATIVE mg/dL   Protein, ur NEGATIVE NEGATIVE mg/dL   Nitrite NEGATIVE NEGATIVE   Leukocytes, UA NEGATIVE NEGATIVE   Squamous Epithelial / LPF 21-50 0 - 5   WBC, UA 0-5 0 - 5 WBC/hpf   RBC / HPF NONE SEEN 0 - 5 RBC/hpf   Bacteria, UA RARE (A) NONE SEEN   Mucus PRESENT   Wet prep, genital     Status: Abnormal   Collection Time: 09/03/17  3:37 PM  Result Value Ref Range   Yeast Wet Prep HPF POC NONE SEEN NONE SEEN   Trich, Wet Prep NONE SEEN NONE SEEN   Clue Cells Wet Prep HPF POC PRESENT (A) NONE SEEN   WBC, Wet Prep HPF POC FEW (A) NONE SEEN   Sperm NONE SEEN   Pregnancy, urine     Status: None   Collection Time: 09/03/17  3:45 PM  Result Value Ref Range   Preg Test, Ur NEGATIVE NEGATIVE   No results found.  ED Clinical Impression  BV (bacterial vaginosis)   ED Assessment/Plan  Outside records reviewed.  As noted in HPI.  Wet prep positive for BV.  Urine negative for UTI.  U preg negative.  Gonorrhea, chlamydia pending.  Patient wishes to defer treatment pending culture results.  Also sending off HIV, RPR because she is in a new relationship. Will send home with flagyl. Advised pt to refrain from sexual contact until she knows lab results, symptoms resolve, and partner(s) are treated if necessary. Pt provided working phone number. Follow-up with PMD as needed. Discussed labs, MDM, plan and followup with patient. Pt agrees with plan.   Meds ordered this encounter  Medications  . metroNIDAZOLE (FLAGYL) 500 MG tablet    Sig: Take 1 tablet (500 mg total) by mouth 2 (two) times daily for 7 days.    Dispense:  14 tablet    Refill:  0    *This clinic note was created using Scientist, clinical (histocompatibility and immunogenetics). Therefore, there may be occasional mistakes despite careful proofreading.  ?    Domenick Gong, MD 09/03/17 1609

## 2017-09-03 NOTE — Discharge Instructions (Addendum)
Take the medication as written. Give us a working phone number so that we can contact you if needed. Refrain from sexual contact until you feeling better, you know your results and your partner(s) are treated if necessary. Return to the ER if you get worse, have a fever >100.4, or for any concerns.   Go to www.goodrx.com to look up your medications. This will give you a list of where you can find your prescriptions at the most affordable prices. Or ask the pharmacist what the cash price is, or if they have any other discount programs available to help make your medication more affordable. This can be less expensive than what you would pay with insurance.

## 2017-09-03 NOTE — ED Triage Notes (Signed)
For 1 month, Dysuria, discharge that she is unable to describe, foul odor and was treated with a 5 day course of antibiotics but did not get any testing done was just treated by her pcp. Unsure of any std transmission.

## 2017-09-04 LAB — RPR: RPR: NONREACTIVE

## 2017-09-04 LAB — HIV ANTIBODY (ROUTINE TESTING W REFLEX): HIV Screen 4th Generation wRfx: NONREACTIVE

## 2017-11-19 ENCOUNTER — Telehealth: Payer: Self-pay

## 2017-11-19 ENCOUNTER — Other Ambulatory Visit: Payer: Self-pay

## 2017-11-19 ENCOUNTER — Ambulatory Visit
Admission: EM | Admit: 2017-11-19 | Discharge: 2017-11-19 | Disposition: A | Discharge status: Home / Self Care | Payer: Medicaid Other | Attending: Emergency Medicine | Admitting: Emergency Medicine

## 2017-11-19 DIAGNOSIS — A549 Gonococcal infection, unspecified: Secondary | ICD-10-CM

## 2017-11-19 MED ORDER — ONDANSETRON 8 MG PO TBDP: 8.0000 mg | ORAL_TABLET | Freq: Three times a day (TID) | ORAL | 0 refills | Status: AC | PRN

## 2017-11-19 MED ORDER — GEMIFLOXACIN MESYLATE 320 MG PO TABS: 320.0000 mg | ORAL_TABLET | Freq: Once | ORAL | 0 refills | Status: AC

## 2017-11-19 MED ORDER — AZITHROMYCIN 500 MG PO TABS: 2000.0000 mg | ORAL_TABLET | Freq: Once | ORAL | 0 refills | Status: AC

## 2017-11-19 NOTE — ED Triage Notes (Signed)
Patient states that she was seen by PCP and was tested for STDs. Patient is positive for gonorrhea and needs treatment. Patient chose to come here for treatment, my chart message from [facility] stating patient choice.

## 2017-11-19 NOTE — Discharge Instructions (Addendum)
No Intercourse until you know that your repeat testing is negative.  I would get rechecked in 2 weeks.

## 2017-11-19 NOTE — ED Provider Notes (Addendum)
HPI  SUBJECTIVE:  Christina Hall is a 22 y.o. female who presents for treatment of gonorrhea.  States that she had oral and vaginal sex with father of her baby who recently returned from [place].  States that she went to her PMD for urinary complaints and was tested, found to be positive for gonorrhea, negative for chlamydia.  She was sent here for treatment.  States that she has not been sexually active with anyone else over this past year.  She denies sore throat, vaginal odor.  She does report increased discharge.  No genital rash, itching.  Occasional dysuria, no urinary urgency, cloudy or odorous urine, hematuria.  No abdominal, back, pelvic pain.  She has never had gonorrhea before.  She has a history of chlamydia, BV.  No history of HIV, HSV, syphilis, Trichomonas, yeast.  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  She states that she has never tried a cephalosporin.  LMP: She has a Nexplanon.  Denies the possibility being pregnant.  PMD: [facility].  Care everywhere labs - positive for gonorrhea on 9/19, negative for chlamydia.  HIV, RPR, wet prep not done.  Past Medical History:  Diagnosis Date  . Depression     Past Surgical History:  Procedure Laterality Date  . TONSILLECTOMY      History reviewed. No pertinent family history.  Social History   Tobacco Use  . Smoking status: Current Every Day Smoker    Packs/day: 0.50    Types: Cigarettes  . Smokeless tobacco: Never Used  Substance Use Topics  . Alcohol use: No  . Drug use: No    No current facility-administered medications for this encounter.   Current Outpatient Medications:  .  sertraline (ZOLOFT) 25 MG tablet, Take by mouth., Disp: , Rfl:  .  SUBOXONE 8-2 MG FILM, PLACE 1 FILM (8 MG TOTAL) UNDER THE TONGUE DAILY. FOR 21 DAYS, Disp: , Rfl: 0 .  azithromycin (ZITHROMAX) 500 MG tablet, Take 4 tablets (2,000 mg total) by mouth once for 1 dose., Disp: 4  tablet, Rfl: 0 .  gemifloxacin (FACTIVE) 320 MG tablet, Take 1 tablet (320 mg total) by mouth once for 1 dose., Disp: 1 tablet, Rfl: 0  Allergies  Allergen Reactions  . Penicillins Anaphylaxis     ROS  As noted in HPI.   Physical Exam  BP 124/64 (BP Location: Left Arm)   Pulse 60   Temp 98.7 F (37.1 C) (Oral)   Resp 18   Ht 5\' 4"  (1.626 m)   Wt 70.8 kg   SpO2 100%   BMI 26.78 kg/m   Constitutional: Well developed, well nourished, no acute distress Eyes:  EOMI, conjunctiva normal bilaterally HENT: Normocephalic, atraumatic,mucus membranes moist Respiratory: Normal inspiratory effort Cardiovascular: Normal rate GI: nondistended skin: No rash, skin intact Musculoskeletal: no deformities Neurologic: Alert & oriented x 3, no focal neuro deficits Psychiatric: Speech and behavior appropriate   ED Course   Medications - No data to display  No orders of the defined types were placed in this encounter.   No results found for this or any previous visit (from the past 24 hour(s)). No results found.  ED Clinical Impression  Gonorrhea   ED Assessment/Plan  Outside labs reviewed.  As noted in HPI.  Patient declined HIV, RPR testing, or to do a wet prep.  She has anaphylaxis to penicillins although she states that it was over 10 years ago, and she was amenable  to try the Rocephin, however, I am concerned that we could send her in anaphylaxis again, so will treat with 2 g of azithromycin and gemifloxacin 320 mg p.o. once.  Advised for her to get rechecked for gonorrhea and chlamydia in 2 weeks.  Pharmacy called, state that they do not have gemifloxacin available, states that it has been discontinued in the [place].  They also state that they do not have gentamicin.  I had staff call the inpatient pharmacy to see if we can get a dose of gentamicin, they left a message.  If we are able to get a dose of gentamicin 240 mg IM, we need to bring the patient back and give it to her.   I called the patient to inform her of this plan, and also advised that she may be able to get the gentamicin at the local health department.  The other option that she has is to get retested in 2 weeks.  She states that she is vomiting from the azithromycin, so will call in prescription of Zofran 8 mg #20 to the [facility] on [address].  Discussed labs, MDM, treatment plan, and plan for follow-up with patient  patient agrees with plan.   Meds ordered this encounter  Medications  . azithromycin (ZITHROMAX) 500 MG tablet    Sig: Take 4 tablets (2,000 mg total) by mouth once for 1 dose.    Dispense:  4 tablet    Refill:  0  . gemifloxacin (FACTIVE) 320 MG tablet    Sig: Take 1 tablet (320 mg total) by mouth once for 1 dose.    Dispense:  1 tablet    Refill:  0    *This clinic note was created using Scientist, clinical (histocompatibility and immunogenetics). Therefore, there may be occasional mistakes despite careful proofreading.   ?   Domenick Gong, MD 11/19/17 1756    Domenick Gong, MD 11/19/17 [phone]

## 2017-12-02 ENCOUNTER — Other Ambulatory Visit: Payer: Self-pay

## 2017-12-02 ENCOUNTER — Emergency Department
Admission: EM | Admit: 2017-12-02 | Discharge: 2017-12-02 | Disposition: A | Discharge status: Home / Self Care | Payer: Medicaid Other | Attending: Emergency Medicine | Admitting: Emergency Medicine

## 2017-12-02 ENCOUNTER — Encounter: Payer: Self-pay | Admitting: Emergency Medicine

## 2017-12-02 DIAGNOSIS — A599 Trichomoniasis, unspecified: Secondary | ICD-10-CM | POA: Diagnosis not present

## 2017-12-02 DIAGNOSIS — F17210 Nicotine dependence, cigarettes, uncomplicated: Secondary | ICD-10-CM | POA: Insufficient documentation

## 2017-12-02 DIAGNOSIS — Z79899 Other long term (current) drug therapy: Secondary | ICD-10-CM | POA: Insufficient documentation

## 2017-12-02 DIAGNOSIS — Z202 Contact with and (suspected) exposure to infections with a predominantly sexual mode of transmission: Secondary | ICD-10-CM | POA: Diagnosis present

## 2017-12-02 LAB — URINALYSIS, COMPLETE (UACMP) WITH MICROSCOPIC
Bilirubin Urine: NEGATIVE
GLUCOSE, UA: NEGATIVE mg/dL
Hgb urine dipstick: NEGATIVE
Ketones, ur: NEGATIVE mg/dL
Nitrite: NEGATIVE
PH: 7 (ref 5.0–8.0)
Protein, ur: NEGATIVE mg/dL
Specific Gravity, Urine: 1.011 (ref 1.005–1.030)

## 2017-12-02 LAB — WET PREP, GENITAL
Clue Cells Wet Prep HPF POC: NONE SEEN
SPERM: NONE SEEN
YEAST WET PREP: NONE SEEN

## 2017-12-02 MED ORDER — ONDANSETRON 4 MG PO TBDP
4.0000 mg | ORAL_TABLET | Freq: Once | ORAL | Status: AC
  Administered 2017-12-02: 4 mg via ORAL
  Filled 2017-12-02: qty 1

## 2017-12-02 MED ORDER — METRONIDAZOLE 500 MG PO TABS
500.0000 mg | ORAL_TABLET | Freq: Once | ORAL | Status: AC
  Administered 2017-12-02: 500 mg via ORAL
  Filled 2017-12-02: qty 1

## 2017-12-02 MED ORDER — METRONIDAZOLE 500 MG PO TABS: 500.0000 mg | ORAL_TABLET | Freq: Two times a day (BID) | ORAL | 0 refills | Status: AC

## 2017-12-02 MED ORDER — GENTAMICIN SULFATE 40 MG/ML IJ SOLN
240.0000 mg | Freq: Once | INTRAMUSCULAR | Status: AC
  Administered 2017-12-02: 240 mg via INTRAMUSCULAR
  Filled 2017-12-02: qty 6

## 2017-12-02 MED ORDER — AZITHROMYCIN 500 MG PO TABS
1000.0000 mg | ORAL_TABLET | Freq: Once | ORAL | Status: AC
  Administered 2017-12-02: 1000 mg via ORAL
  Filled 2017-12-02: qty 2
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
## 2017-12-02 NOTE — Discharge Instructions (Signed)
You have been treated for gonorrhea, chlamydia, and trichomoniasis. You should take the antibiotic as directed. Any and all sexual partners should be treated. Follow-up with your provider for ongoing management.

## 2017-12-02 NOTE — ED Triage Notes (Signed)
Pt states she had STD check and was called back for + gonorrhea. PT states [facility] gave her RX and pharm was out of one med. Pt states she has been having increased vaginal d/c. VSS

## 2017-12-02 NOTE — ED Notes (Addendum)
See triage note  States she was tested by her PCP and told that she has gonorrhea  Was given 2 rx's states she took the z-max and gemifloxacin    States she still Is having sxs'  States she is requesting im meds

## 2018-04-27 IMAGING — DX DG CHEST 1V PORT
1 series · 1 of 1 positions shown · non-contrast
Comparison: None.

CLINICAL DATA: Shortness of breath with deep breath.

EXAM:
PORTABLE CHEST 1 VIEW

[chest ap]
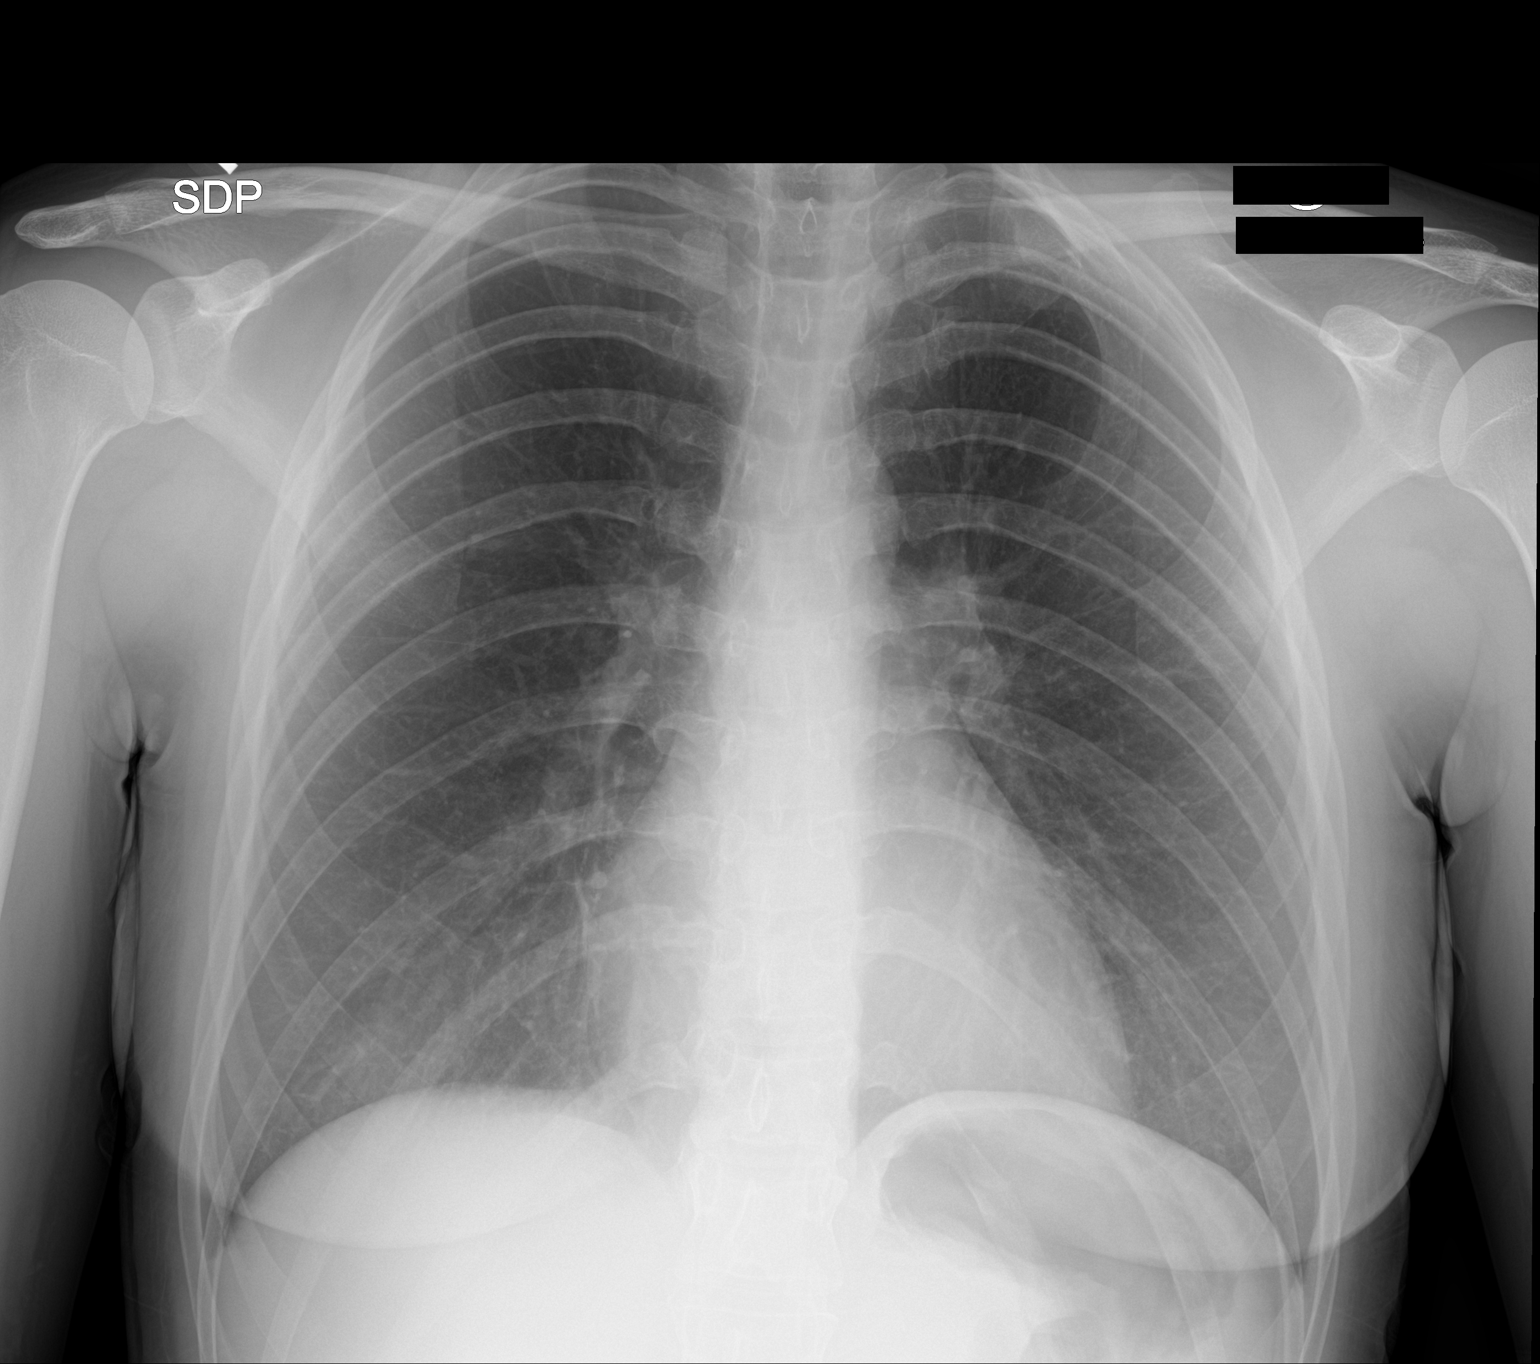

[1 of 1 positions shown; findings below may reference images not displayed]

FINDINGS: The lungs are clear. Heart size is normal. No pneumothorax or
pleural effusion. No bony abnormality.
IMPRESSION: Negative chest.
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
## 2020-05-22 ENCOUNTER — Emergency Department
Admission: EM | Admit: 2020-05-22 | Discharge: 2020-05-22 | Disposition: A | Discharge status: Home / Self Care | Payer: Medicaid Other | Attending: Emergency Medicine | Admitting: Emergency Medicine

## 2020-05-22 ENCOUNTER — Other Ambulatory Visit: Payer: Self-pay

## 2020-05-22 ENCOUNTER — Encounter: Payer: Self-pay | Admitting: Emergency Medicine

## 2020-05-22 DIAGNOSIS — F17210 Nicotine dependence, cigarettes, uncomplicated: Secondary | ICD-10-CM | POA: Diagnosis not present

## 2020-05-22 DIAGNOSIS — L02215 Cutaneous abscess of perineum: Secondary | ICD-10-CM | POA: Insufficient documentation

## 2020-05-22 DIAGNOSIS — L0291 Cutaneous abscess, unspecified: Secondary | ICD-10-CM

## 2020-05-22 DIAGNOSIS — R222 Localized swelling, mass and lump, trunk: Secondary | ICD-10-CM | POA: Diagnosis present

## 2020-05-22 MED ORDER — LIDOCAINE-EPINEPHRINE (PF) 2 %-1:200000 IJ SOLN
10.0000 mL | Freq: Once | INTRAMUSCULAR | Status: AC
  Administered 2020-05-22: 10 mL
  Filled 2020-05-22: qty 20

## 2020-05-22 MED ORDER — CLINDAMYCIN HCL 300 MG PO CAPS: 300.0000 mg | ORAL_CAPSULE | Freq: Four times a day (QID) | ORAL | 0 refills | Status: AC

## 2020-05-22 MED ORDER — CLINDAMYCIN HCL 150 MG PO CAPS
300.0000 mg | ORAL_CAPSULE | Freq: Once | ORAL | Status: AC
  Administered 2020-05-22: 300 mg via ORAL
  Filled 2020-05-22: qty 2
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
## 2020-05-22 NOTE — ED Triage Notes (Signed)
Pt comes into the ED via POV c/o abscess on the left buttock.  Pt states it has been there x 3 days.  Pt in NAD at this time and is ambulatory to triage.

## 2020-08-13 ENCOUNTER — Emergency Department
Admission: EM | Admit: 2020-08-13 | Discharge: 2020-08-13 | Disposition: A | Discharge status: Home / Self Care | Payer: Medicaid Other | Attending: Emergency Medicine | Admitting: Emergency Medicine

## 2020-08-13 ENCOUNTER — Other Ambulatory Visit: Payer: Self-pay

## 2020-08-13 ENCOUNTER — Encounter: Payer: Self-pay | Admitting: Emergency Medicine

## 2020-08-13 DIAGNOSIS — Z202 Contact with and (suspected) exposure to infections with a predominantly sexual mode of transmission: Secondary | ICD-10-CM

## 2020-08-13 DIAGNOSIS — N898 Other specified noninflammatory disorders of vagina: Secondary | ICD-10-CM | POA: Insufficient documentation

## 2020-08-13 DIAGNOSIS — F17210 Nicotine dependence, cigarettes, uncomplicated: Secondary | ICD-10-CM | POA: Diagnosis not present

## 2020-08-13 DIAGNOSIS — R300 Dysuria: Secondary | ICD-10-CM

## 2020-08-13 DIAGNOSIS — R350 Frequency of micturition: Secondary | ICD-10-CM | POA: Insufficient documentation

## 2020-08-13 LAB — WET PREP, GENITAL
Clue Cells Wet Prep HPF POC: NONE SEEN
Sperm: NONE SEEN
Trich, Wet Prep: NONE SEEN
Yeast Wet Prep HPF POC: NONE SEEN

## 2020-08-13 LAB — URINALYSIS, COMPLETE (UACMP) WITH MICROSCOPIC
Bilirubin Urine: NEGATIVE
Glucose, UA: NEGATIVE mg/dL
Hgb urine dipstick: NEGATIVE
Ketones, ur: NEGATIVE mg/dL
Leukocytes,Ua: NEGATIVE
Nitrite: NEGATIVE
Protein, ur: NEGATIVE mg/dL
Specific Gravity, Urine: 1.015 (ref 1.005–1.030)
pH: 6 (ref 5.0–8.0)

## 2020-08-13 LAB — POC URINE PREG, ED: Preg Test, Ur: NEGATIVE

## 2020-08-13 MED ORDER — CEFTRIAXONE SODIUM 1 G IJ SOLR
500.0000 mg | Freq: Once | INTRAMUSCULAR | Status: AC
  Administered 2020-08-13: 500 mg via INTRAMUSCULAR
  Filled 2020-08-13: qty 10

## 2020-08-13 MED ORDER — DOXYCYCLINE MONOHYDRATE 100 MG PO CAPS: 100.0000 mg | ORAL_CAPSULE | Freq: Two times a day (BID) | ORAL | 0 refills | Status: AC

## 2020-08-13 MED ORDER — METRONIDAZOLE 500 MG PO TABS
2000.0000 mg | ORAL_TABLET | Freq: Once | ORAL | Status: AC
  Administered 2020-08-13: 2000 mg via ORAL
  Filled 2020-08-13: qty 4

## 2020-08-13 NOTE — ED Triage Notes (Signed)
Pt to ED from home c/o vaginal discharge, burning with urination for several days.  Wants to be checked for STD and have pregnancy test.  Signature pad unable to reach pt.  Pt understands MSE waiver.

## 2020-08-13 NOTE — Discharge Instructions (Addendum)
Take Doxycycline twice daily for seven days.  
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
## 2020-08-14 LAB — CHLAMYDIA/NGC RT PCR (ARMC ONLY)
Chlamydia Tr: NOT DETECTED
N gonorrhoeae: NOT DETECTED
